# Patient Record
Sex: Male | Born: 1940 | Race: White | Hispanic: No | Marital: Married | Smoking: Former smoker
Health system: Southern US, Community
[De-identification: ages and names within clinical notes are randomized; demographics above are authoritative.]

## PROBLEM LIST (undated history)

## (undated) DIAGNOSIS — E785 Hyperlipidemia, unspecified: Secondary | ICD-10-CM

## (undated) DIAGNOSIS — J302 Other seasonal allergic rhinitis: Secondary | ICD-10-CM

## (undated) DIAGNOSIS — I10 Essential (primary) hypertension: Secondary | ICD-10-CM

## (undated) DIAGNOSIS — G4730 Sleep apnea, unspecified: Secondary | ICD-10-CM

## (undated) HISTORY — DX: Other seasonal allergic rhinitis: J30.2

## (undated) HISTORY — DX: Sleep apnea, unspecified: G47.30

## (undated) HISTORY — DX: Essential (primary) hypertension: I10

## (undated) HISTORY — DX: Hyperlipidemia, unspecified: E78.5

---

## 1993-07-01 HISTORY — PX: WISDOM TOOTH EXTRACTION: SHX21

## 1999-09-03 ENCOUNTER — Encounter: Payer: Self-pay | Admitting: Family Medicine

## 1999-09-03 ENCOUNTER — Encounter: Admission: RE | Admit: 1999-09-03 | Discharge: 1999-09-03 | Payer: Self-pay | Admitting: Family Medicine

## 2005-08-04 ENCOUNTER — Ambulatory Visit (HOSPITAL_BASED_OUTPATIENT_CLINIC_OR_DEPARTMENT_OTHER): Admission: RE | Admit: 2005-08-04 | Discharge: 2005-08-04 | Payer: Self-pay | Admitting: Family Medicine

## 2005-08-11 ENCOUNTER — Ambulatory Visit: Payer: Self-pay | Admitting: Internal Medicine

## 2006-08-21 ENCOUNTER — Encounter: Admission: RE | Admit: 2006-08-21 | Discharge: 2006-08-21 | Payer: Self-pay | Admitting: Family Medicine

## 2007-06-30 IMAGING — CR DG CHEST 2V
2 series · 2 of 2 positions shown · non-contrast
Comparison: none

CLINICAL DATA: Productive cough for several days. 
 CHEST - 2 VIEW:

[view not recorded (1 of 2)]
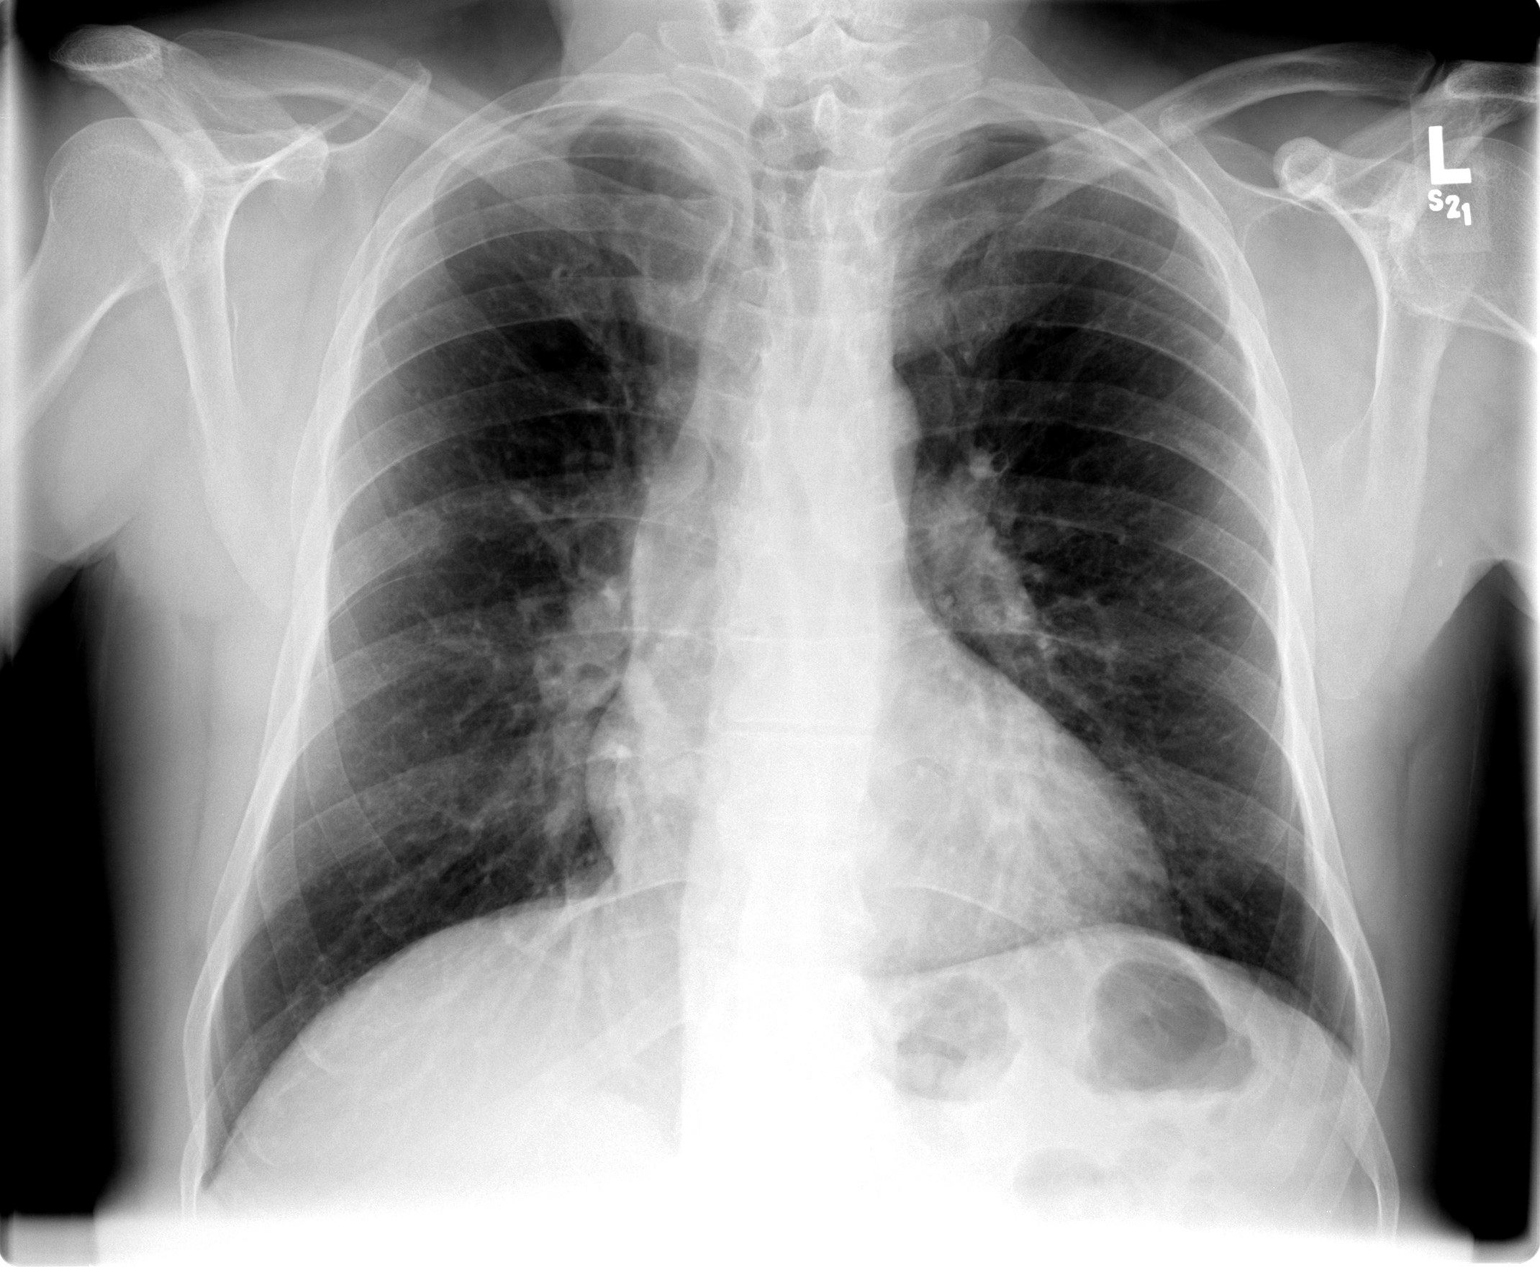

[view not recorded (2 of 2)]
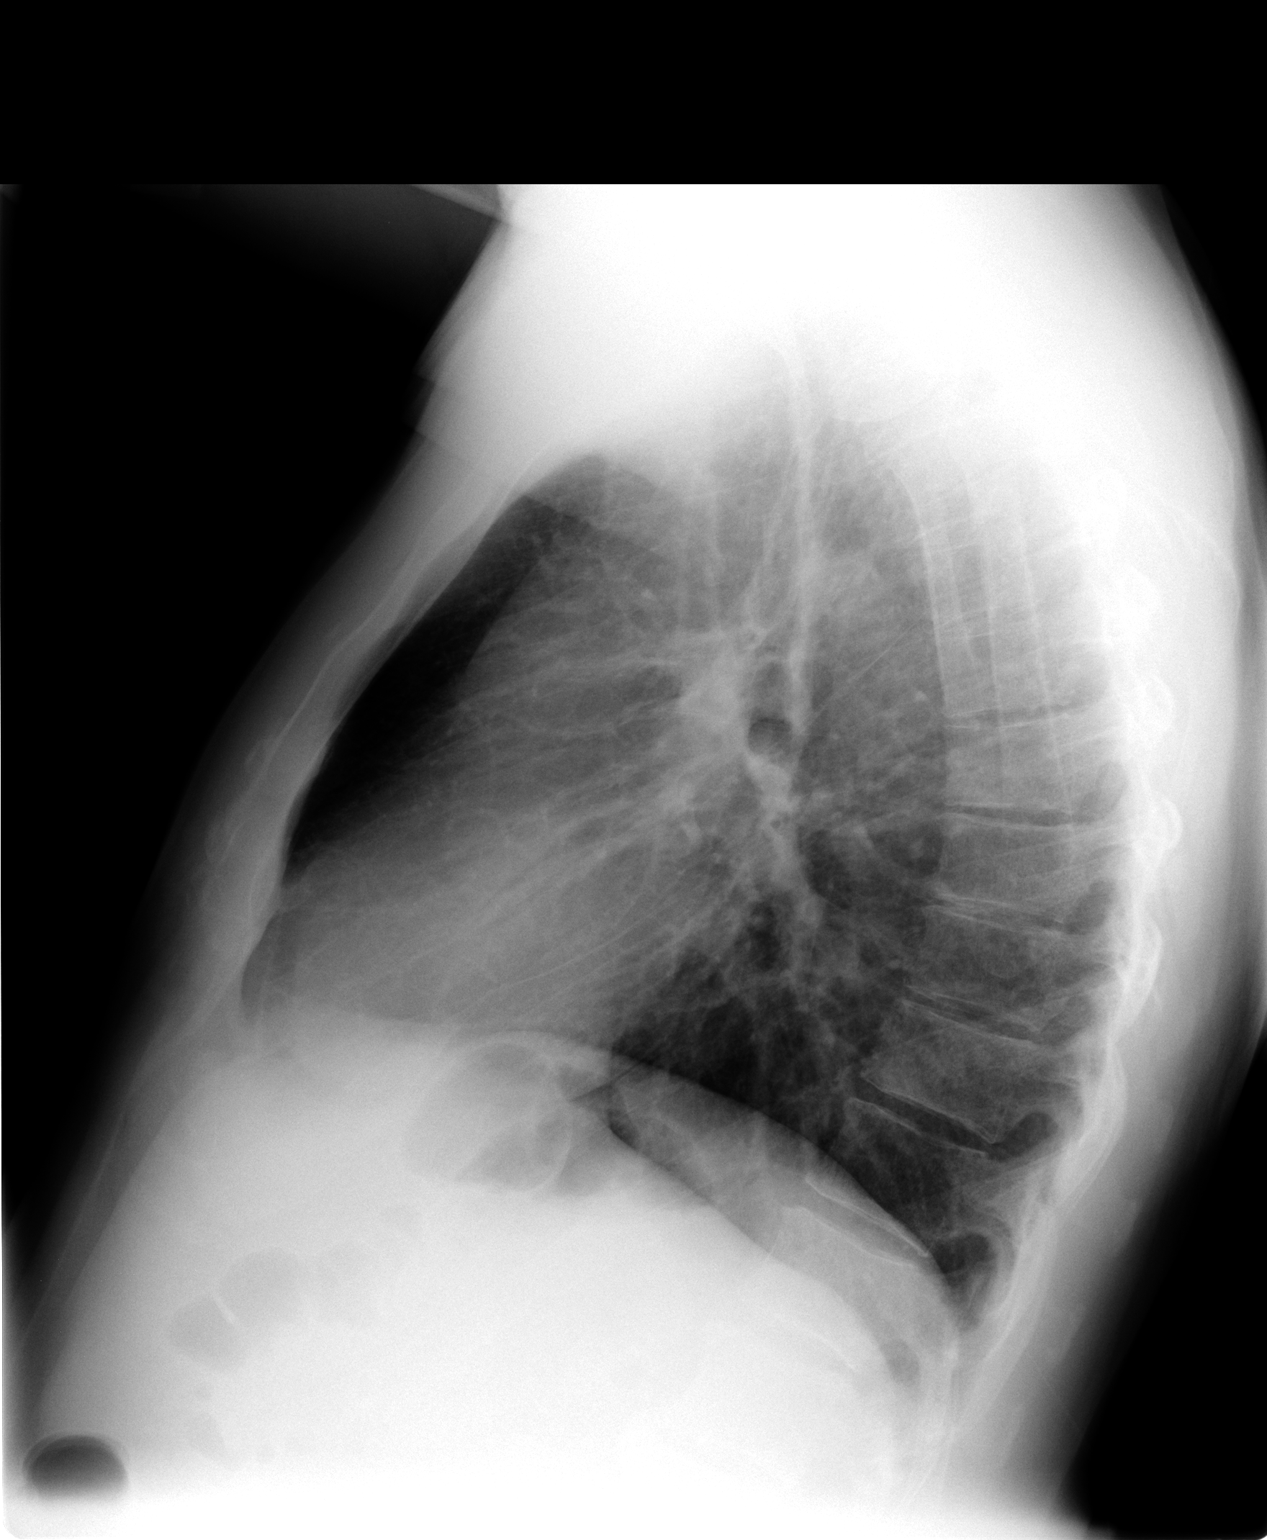

[2 of 2 positions shown; findings below may reference images not displayed]

FINDINGS: Two views of the chest show no pneumonia.  Mild peribronchial thickening is noted.  The heart is within normal limits in size.
IMPRESSION: No pneumonia.  Mild peribronchial thickening.

## 2008-07-01 HISTORY — PX: HERNIA REPAIR: SHX51

## 2008-10-07 ENCOUNTER — Ambulatory Visit (HOSPITAL_COMMUNITY): Admission: RE | Admit: 2008-10-07 | Discharge: 2008-10-07 | Payer: Self-pay | Admitting: General Surgery

## 2009-08-14 IMAGING — CR DG CHEST 2V
2 series · 2 of 2 positions shown · non-contrast
Comparison: 08/21/2006

CLINICAL DATA: Preop for left inguinal hernia

CHEST - 2 VIEW

[view not recorded (1 of 2)]
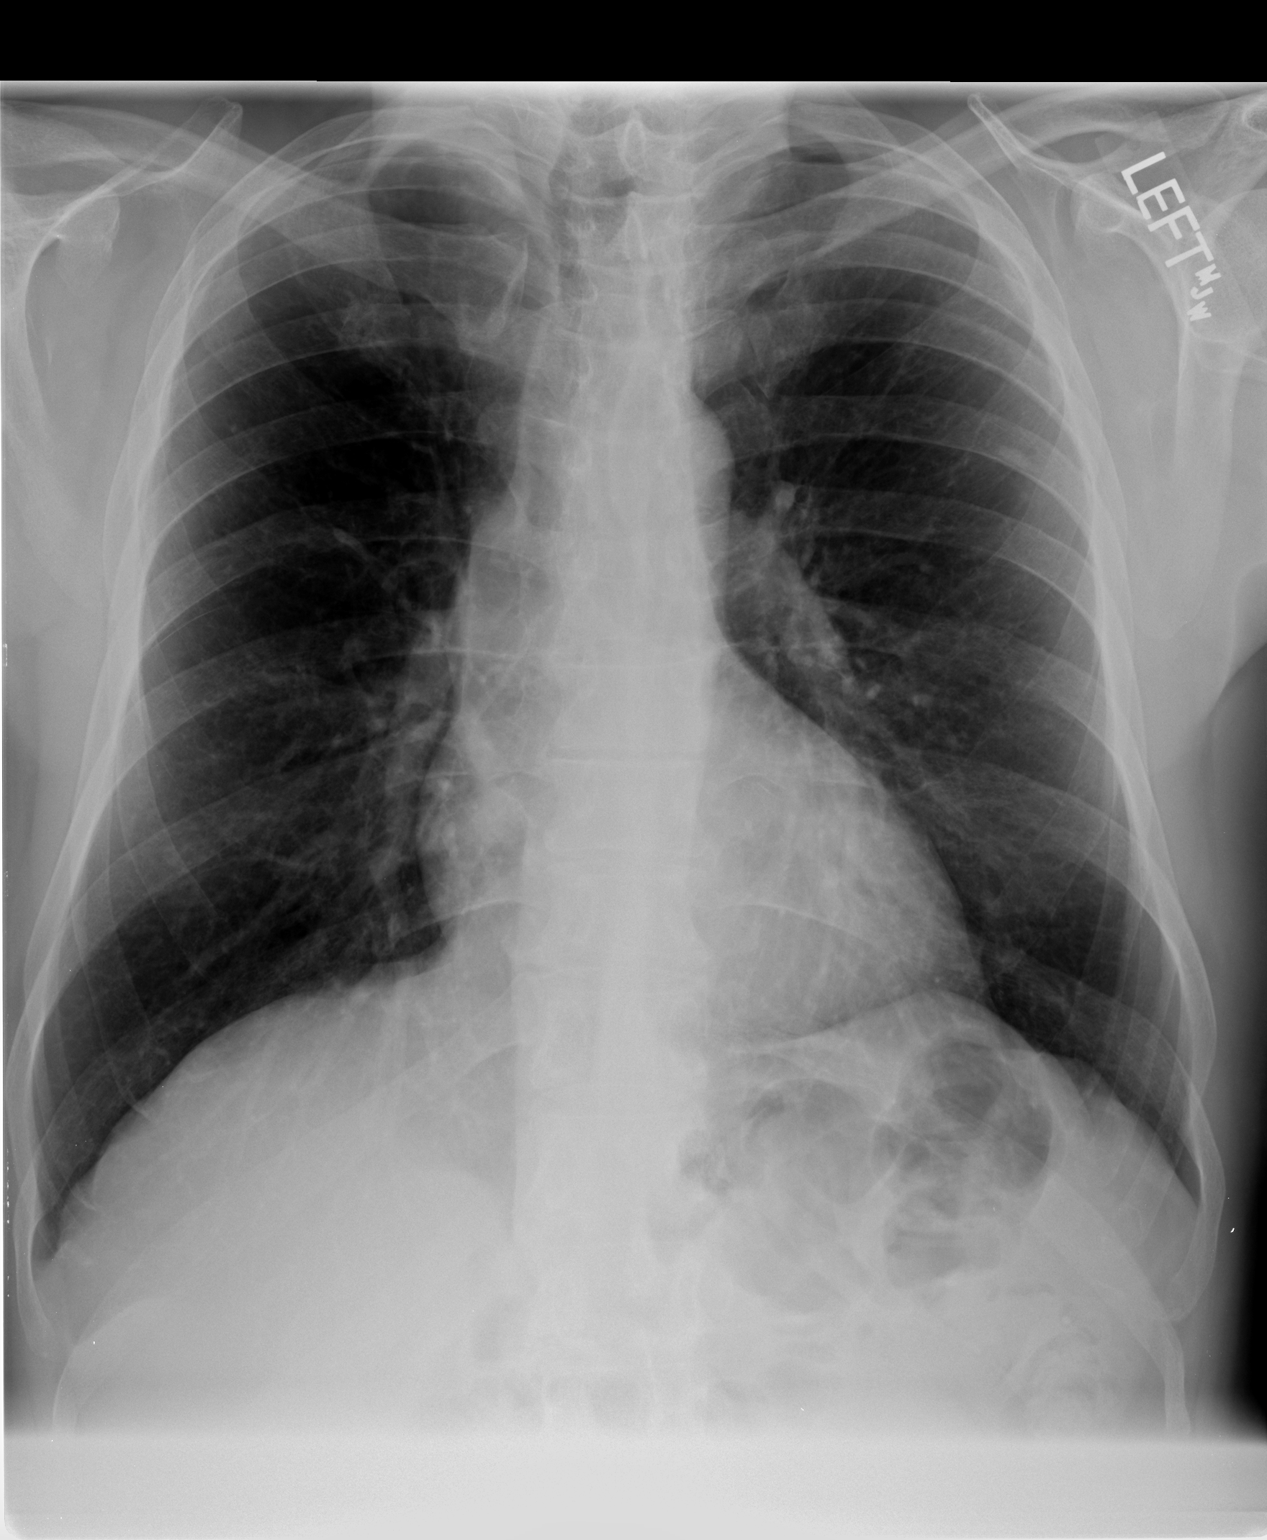

[view not recorded (2 of 2)]
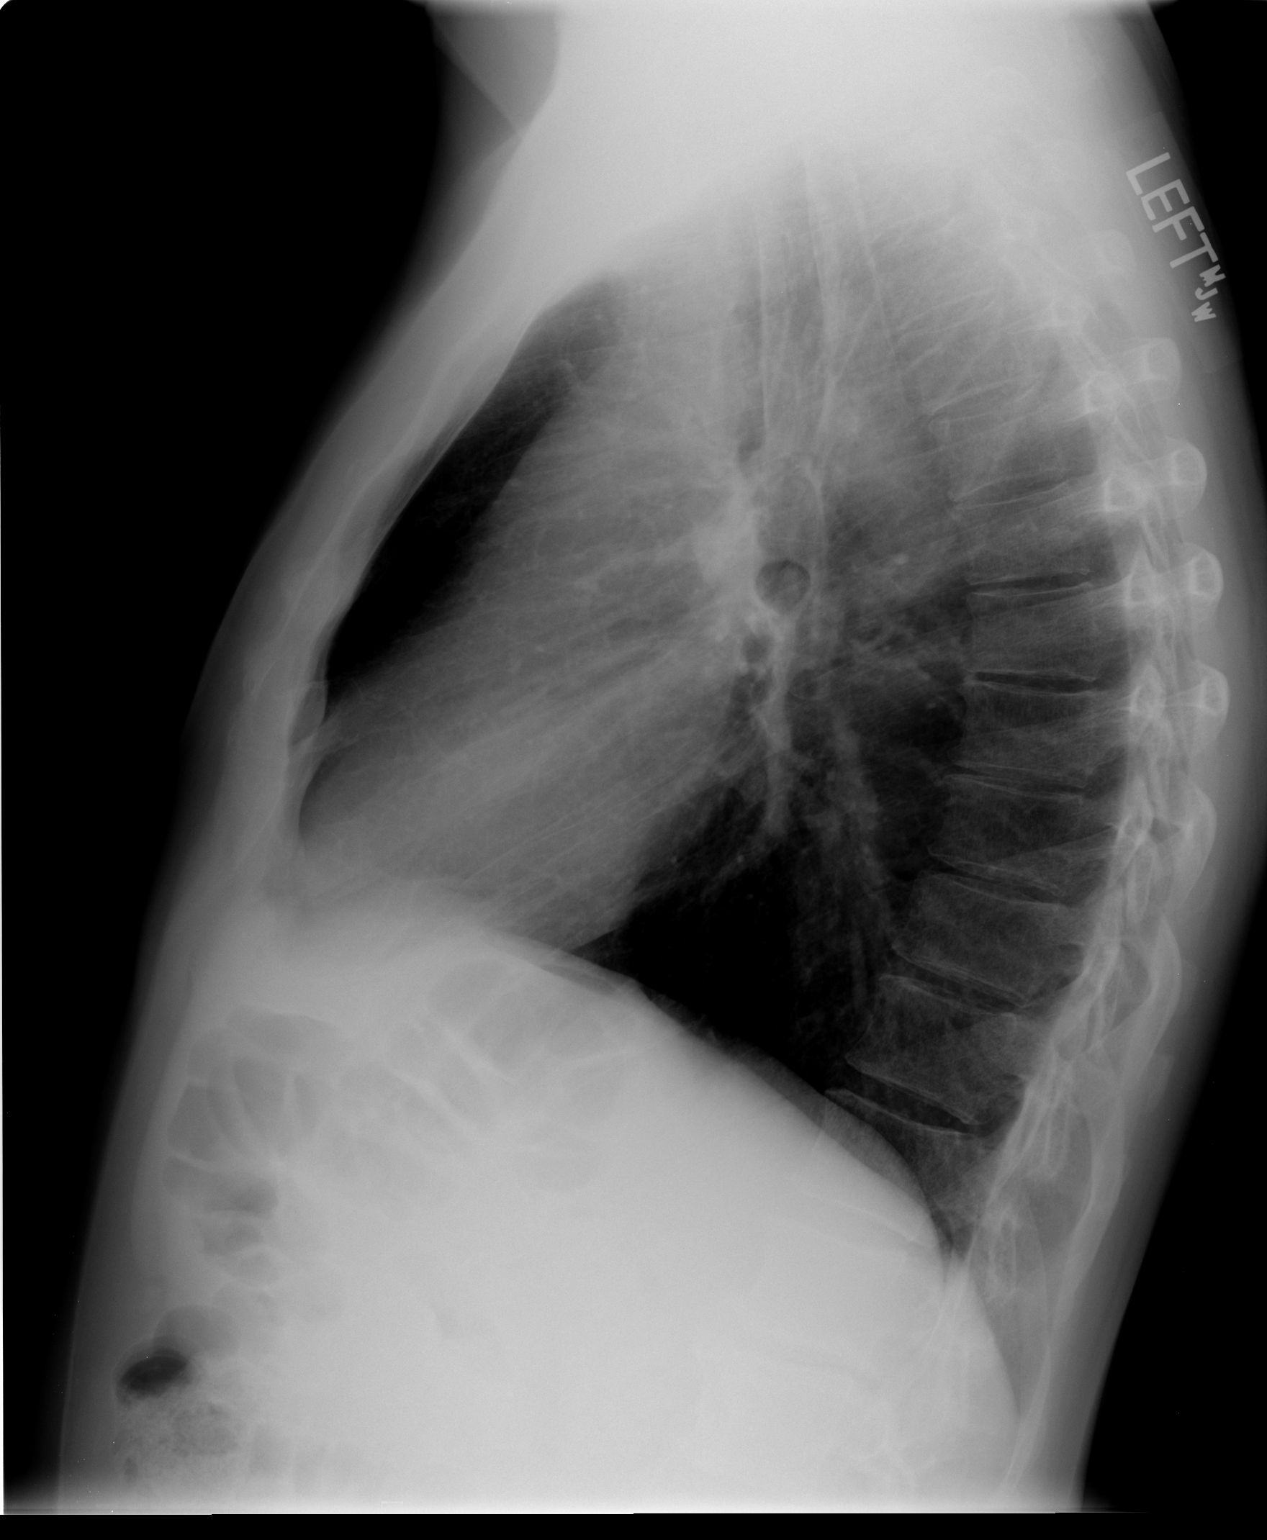

[2 of 2 positions shown; findings below may reference images not displayed]

FINDINGS: Heart and mediastinal contours normal.  Lungs clear.  The
lungs are mildly hyperaerated.  Probable early COPD.Osseous
structures intact with a stable small sclerotic ovoid density in
the left posterior sixth rib.  This is probably a small bone island
or other benign bone lesion.  No pleural fluid.
IMPRESSION: 1.  Probable COPD - no active disease.
2.  Small sclerotic lesion of the left posterior sixth rib - no
change.

## 2010-10-10 LAB — CBC
HCT: 41.2 % (ref 39.0–52.0)
Hemoglobin: 14 g/dL (ref 13.0–17.0)
MCHC: 33.9 g/dL (ref 30.0–36.0)
MCV: 91.7 fL (ref 78.0–100.0)
Platelets: 245 10*3/uL (ref 150–400)
RBC: 4.49 MIL/uL (ref 4.22–5.81)
RDW: 13.2 % (ref 11.5–15.5)
WBC: 6.6 10*3/uL (ref 4.0–10.5)

## 2010-10-10 LAB — BASIC METABOLIC PANEL WITH GFR
BUN: 12 mg/dL (ref 6–23)
CO2: 26 meq/L (ref 19–32)
Calcium: 9.4 mg/dL (ref 8.4–10.5)
Chloride: 103 meq/L (ref 96–112)
Creatinine, Ser: 1.17 mg/dL (ref 0.4–1.5)
GFR calc Af Amer: >60 mL/min (ref >60)
GFR calc non Af Amer: >60 mL/min (ref >60)
Glucose, Bld: 102 mg/dL — ABNORMAL HIGH (ref 70–99)
Potassium: 4.4 meq/L (ref 3.5–5.1)
Sodium: 136 meq/L (ref 135–145)

## 2010-11-13 NOTE — Op Note (Signed)
NAMESALOMON, GANSER NO.:  [id number]   MEDICAL RECORD NO.:  [id number]          PATIENT TYPE:  AMB   LOCATION:  SDS                          FACILITY:  [facility]   PHYSICIAN:  Lennie Muckle, MD      DATE OF BIRTH:  April 23, 1941   DATE OF PROCEDURE:  10/07/2008  DATE OF DISCHARGE:  10/07/2008                               OPERATIVE REPORT   PREOPERATIVE DIAGNOSIS:  Left inguinal hernia.   POSTOPERATIVE DIAGNOSIS:  Left inguinal hernia.   PROCEDURE:  Open hernia repair with mesh on the left.   SURGEON:  Lennie Muckle, MD.   ASSISTANT:  No assistant.   ANESTHESIA:  General endotracheal anesthesia.   FINDINGS:  A week 4 with indirect hernia.   SPECIMENS:  No specimens.   COMPLICATIONS:  No immediate complications.   ESTIMATED BLOOD LOSS:  Minimal blood loss.   INDICATION FOR PROCEDURE:  Mr. Scheidt is a 70 year old male who has had a  couple of months of left lower abdominal pain.  No nausea or vomiting.  He did notice a bulge in his groin.  His examination was consistent with  a left inguinal hernia repair.  I had discussed with him preoperatively  of laparoscopic open repair.  He was given reading material on this  subject and we have decided to perform an open repair.   DETAILS OF PROCEDURE:  He received a g of vancomycin and was taken to  the operating room.  Once in the operating room, he was placed in the  supine position.  After administration of general endotracheal  anesthesia, his lower abdomen was prepped and draped in usual sterile  fashion.  A time-out procedure indicating the patient and the procedure  were performed.  I placed an incision along the area of the ilioinguinal  ligament with a #15 blade, divided subcutaneous tissues with  electrocautery and I then placed an incision at external oblique fascia.  I opened this with Metzenbaum scissors to open the internal-external  ring.  I then dissected out the shelving edge of the ilioinguinal  ligament.  I got around the spermatic cord and the sac at the pubic  tubercle.  A Penrose drain was placed around the vessels and sac and  then bluntly dissected the hernia sac away from the spermatic cord and  vessels.  I ligated this with a 2-0 silk suture.  This was allowed to  retract back into the abdominal cavity.  The vas deferens vessels  remained intact.  I then secured the piece of 3 x 6 UltraPro mesh which  was trimmed down to size 2.  The ilioinguinal ligament shelving edge and  the internal and transverse oblique musculature with the 2-0 Prolene  suture in a running fashion.  I overlapped the tails around the internal  ring.  I closed down the internal ring to only a pinky palpation for the  exit.  I took the tails under the external oblique fascia.  I then  reapproximated the external oblique fascia over the mesh.  Scarpa was  reapproximated with 3-0  Vicryl suture.  Dermis was closed with 3-0  Vicryl and  skin was closed with 4-0 Monocryl.  The Dermabond was placed with final  dressing.  The patient was extubated, transported to postanesthesia care  unit in stable condition.  He will be given Percocet for pain.  Follow  up with me in approximately 2 or 3 weeks.      Lennie Muckle, MD  Electronically Signed     ALA/MEDQ  D:  10/07/2008  T:  10/08/2008  Job:  [id number]   cc:   Tracey Harries, M.D.

## 2010-11-16 NOTE — Procedures (Signed)
NAME:  Darin [name], Darin [name] NO.:  [id number]   MEDICAL RECORD NO.:  [id number]          PATIENT TYPE:  OUT   LOCATION:  SLEEP CENTER                 FACILITY:  [facility]   PHYSICIAN:  Clinton D. Maple Hudson, M.D. DATE OF BIRTH:  January 02, 1941   DATE OF STUDY:  08/04/2005                              NOCTURNAL POLYSOMNOGRAM   REFERRING PHYSICIAN:  Quita Skye. Artis Flock, M.D.   DATE OF STUDY:  August 04, 2005.   INDICATION FOR STUDY:  Hypersomnia with sleep apnea.   EPWORTH SLEEPINESS SCORE:  16/24.   BMI:  24.   WEIGHT:  170 pounds.   MEDICATIONS:  Lipitor, aspirin.   SLEEP ARCHITECTURE:  Total sleep time 319 minutes with sleep efficiency 80%.  Stage 1 was 18%.  Stage 2 52%.  States 3 and 4 11%.  REM 20% of total sleep  time.  Sleep latency 3.5 minutes.  REM latency 105 minutes.  Awake after  sleep onset 76 minutes.  Arousal index increased at 54.  No bedtime  medication was taken.   RESPIRATORY DATA:  Split study protocol.  Apnea/hypopnea index (AHI, RDI)  89.2 obstructive events per hour indicating severe obstructive sleep  apnea/hypopnea syndrome.  This included 72 obstructive apneas and 142  hypopneas before C-PAP.  Events were not positional.  REM AHI 14 per hour.  C-PAP was titrated to 10 CWP, AHI 1.8 per hour.  A small Respironics comfort  gel nasal mask was used with a heated humidifier.   OXYGEN DATA:  Moderate snoring with oxygen desaturation to a nadir of 89%.  After C-PAP control, saturation held 96% on room air.   CARDIAC DATA:  Normal sinus rhythm with sinus arrhythmia associated with  apneas.   MOVEMENT-PARASOMNIA:  A total of 59 limb jerks were recorded of which 21  were associated with arousal or awakening for a periodic limb movement with  arousal index of 3.9 per hour which is increased.   IMPRESSIONS-RECOMMENDATIONS:  1.  Severe obstructive sleep apnea/hypopnea syndrome, AHI 89.2 per hour with      nonpositional events, moderate snoring and oxygen  desaturation to 89%.  2.  Successful C-PAP titration to 10 CWP, AHI 1.8 per hour.  A small      Respironics comfort gel nasal mask was used with heated humidifier.  3.  Periodic limb movement with arousal, 3.9 per hour.  This may partly      reflect stimulation from the monitor and C-PAP adjustment.  If      clinically significant, it can be treated separately later.      Clinton D. Maple Hudson, M.D.  Diplomate, Biomedical engineer of Sleep Medicine  Electronically Signed     CDY/MEDQ  D:  08/11/2005 09:14:57  T:  08/12/2005 08:19:14  Job:  [id number]

## 2011-03-13 ENCOUNTER — Encounter: Payer: Self-pay | Admitting: Internal Medicine

## 2012-04-01 ENCOUNTER — Encounter: Payer: Self-pay | Admitting: Internal Medicine

## 2013-02-26 ENCOUNTER — Other Ambulatory Visit: Payer: Self-pay | Admitting: Nurse Practitioner

## 2013-02-26 DIAGNOSIS — R9389 Abnormal findings on diagnostic imaging of other specified body structures: Secondary | ICD-10-CM

## 2013-03-10 ENCOUNTER — Other Ambulatory Visit: Payer: Self-pay

## 2014-04-26 ENCOUNTER — Encounter: Payer: Self-pay | Admitting: Internal Medicine

## 2014-06-08 ENCOUNTER — Encounter: Payer: Self-pay | Admitting: Internal Medicine

## 2014-06-15 ENCOUNTER — Encounter: Payer: Self-pay | Admitting: Internal Medicine

## 2014-06-27 ENCOUNTER — Ambulatory Visit (AMBULATORY_SURGERY_CENTER): Payer: Self-pay | Admitting: *Deleted

## 2014-06-27 VITALS — Ht 70.0 in | Wt 172.4 lb

## 2014-06-27 DIAGNOSIS — Z1211 Encounter for screening for malignant neoplasm of colon: Secondary | ICD-10-CM

## 2014-06-27 MED ORDER — MOVIPREP 100 G PO SOLR: 1.0000 | Freq: Once | ORAL | Status: DC

## 2014-06-27 NOTE — Progress Notes (Signed)
Patient has history of colonoscopy greater than 10 years ago, possibly with [facility]. He will look for report at home and bring with him to his procedure.

## 2014-06-27 NOTE — Progress Notes (Signed)
Denies allergies to eggs or soy products. Denies complications with sedation or anesthesia. Denies O2 use. Denies use of diet or weight loss medications.  Emmi instructions given for colonoscopy.  

## 2014-07-11 ENCOUNTER — Encounter: Payer: Self-pay | Admitting: Internal Medicine

## 2014-07-26 ENCOUNTER — Ambulatory Visit (AMBULATORY_SURGERY_CENTER): Payer: Medicare Other | Admitting: Internal Medicine

## 2014-07-26 ENCOUNTER — Encounter: Payer: Self-pay | Admitting: Internal Medicine

## 2014-07-26 VITALS — BP 158/73 | HR 60 | Temp 98.1°F | Resp 13 | Ht 70.0 in | Wt 172.0 lb

## 2014-07-26 DIAGNOSIS — D124 Benign neoplasm of descending colon: Secondary | ICD-10-CM

## 2014-07-26 DIAGNOSIS — D123 Benign neoplasm of transverse colon: Secondary | ICD-10-CM

## 2014-07-26 DIAGNOSIS — Z1211 Encounter for screening for malignant neoplasm of colon: Secondary | ICD-10-CM

## 2014-07-26 DIAGNOSIS — D125 Benign neoplasm of sigmoid colon: Secondary | ICD-10-CM

## 2014-07-26 MED ORDER — SODIUM CHLORIDE 0.9 % IV SOLN: 500.0000 mL | INTRAVENOUS | Status: DC

## 2014-07-26 NOTE — Progress Notes (Signed)
Report to PACU, RN, vss, BBS= Clear.  

## 2014-07-26 NOTE — Op Note (Signed)
[place]  [address]. [place], [address]   COLONOSCOPY PROCEDURE REPORT  PATIENT: Darin, [name]  MR#: [id number] BIRTHDATE: 1941-01-01 , 73  yrs. old GENDER: male ENDOSCOPIST: Jerene Bears, MD REFERRED LH:TDSKA Bouska, M.D. PROCEDURE DATE:  07/26/2014 PROCEDURE:   Colonoscopy with snare polypectomy First Screening Colonoscopy - Avg.  risk and is 50 yrs.  old or older - No.  Prior Negative Screening - Now for repeat screening. 10 or more years since last screening  History of Adenoma - Now for follow-up colonoscopy & has been > or = to 3 yrs.  N/A  Polyps Removed Today? Yes. ASA CLASS:   Class II INDICATIONS:average risk for colon cancer and last colonoscopy completed  years ago. MEDICATIONS: Monitored anesthesia care and Propofol 220 mg IV  DESCRIPTION OF PROCEDURE:   After the risks benefits and alternatives of the procedure were thoroughly explained, informed consent was obtained.  The digital rectal exam revealed no rectal mass.   The LB [id number]  endoscope was introduced through the anus and advanced to the cecum, which was identified by both the appendix and ileocecal valve. No adverse events experienced. The quality of the prep was good, using MoviPrep  The instrument was then slowly withdrawn as the colon was fully examined.   COLON FINDINGS: Five sessile polyps ranging between 3-25mm in size were found in the transverse colon (1), descending colon (2), and sigmoid colon (2).  Polypectomies were performed with a cold snare. The resection was complete, the polyp tissue was completely retrieved and sent to histology.   There was moderate diverticulosis noted throughout the entire examined colon sparing the cecum and rectum.  Retroflexed views revealed internal hemorrhoids. The time to cecum=6 minutes 56 seconds.  Withdrawal time=12 minutes 58 seconds.  The scope was withdrawn and the procedure completed.  COMPLICATIONS: There were no  immediate complications.     ENDOSCOPIC IMPRESSION: 1.   Five sessile polyps ranging between 3-74mm in size were found in the transverse colon, descending colon, and sigmoid colon; polypectomies were performed with a cold snare 2.   Moderate diverticulosis was noted throughout the entire examined colon sparing the cecum and rectum  RECOMMENDATIONS: 1.  Await pathology results 2.  High fiber diet 3.  Timing of repeat colonoscopy will be determined by pathology findings. 4.  You will receive a letter within 1-2 weeks with the results of your biopsy as well as final recommendations.  Please call my office if you have not received a letter after 3 weeks.  eSigned:  Jerene Bears, MD 07/26/2014 4:33 PM   cc: Bernerd Limbo, MD and The Patient   PATIENT NAME:  Darin, [name] MR#: [id number]

## 2014-07-26 NOTE — Patient Instructions (Signed)

## 2014-07-26 NOTE — Progress Notes (Signed)
Called to room to assist during endoscopic procedure.  Patient ID and intended procedure confirmed with present staff. Received instructions for my participation in the procedure from the performing physician.  

## 2014-07-27 ENCOUNTER — Telehealth: Payer: Self-pay | Admitting: *Deleted

## 2014-07-27 NOTE — Telephone Encounter (Signed)
  Follow up Call-  Call back number 07/26/2014  Post procedure Call Back phone  # [phone]  Permission to leave phone message Yes     Patient questions:  Do you have a fever, pain , or abdominal swelling? No. Pain Score  0 *  Have you tolerated food without any problems? Yes.    Have you been able to return to your normal activities? Yes.    Do you have any questions about your discharge instructions: Diet   No. Medications  No. Follow up visit  No.  Do you have questions or concerns about your Care? No.  Actions: * If pain score is 4 or above: No action needed, pain <4.

## 2014-08-01 ENCOUNTER — Encounter: Payer: Self-pay | Admitting: Internal Medicine

## 2016-05-07 ENCOUNTER — Ambulatory Visit: Payer: Medicare Other | Attending: Family Medicine | Admitting: Physical Therapy

## 2016-05-07 ENCOUNTER — Encounter: Payer: Self-pay | Admitting: Physical Therapy

## 2016-05-07 DIAGNOSIS — R2689 Other abnormalities of gait and mobility: Secondary | ICD-10-CM | POA: Insufficient documentation

## 2016-05-07 NOTE — Therapy (Signed)
[place], [place], [address] Phone: [phone]   Fax:  [phone]  Physical Therapy Evaluation  Patient Details  Name: Darin [name] MRN: [id number] Date of Birth: Oct 26, 1940 Referring Provider: Coletta Memos  Encounter Date: 05/07/2016      PT End of Session - 05/07/16 0914    Visit Number 1   Date for PT Re-Evaluation 06/06/16   PT Start Time 0846   PT Stop Time 0919   PT Time Calculation (min) 33 min   Activity Tolerance Patient tolerated treatment well   Behavior During Therapy [facility] for tasks assessed/performed      Past Medical History:  Diagnosis Date  . Hyperlipidemia   . Hypertension   . Seasonal allergies   . Sleep apnea with use of continuous positive airway pressure (CPAP)     Past Surgical History:  Procedure Laterality Date  . HERNIA REPAIR  2010  . WISDOM TOOTH EXTRACTION  1995    There were no vitals filed for this visit.       Subjective Assessment - 05/07/16 0847    Subjective Patient reports that over the past year or so he has been having difficutly with balance while doing yoga.  He goes to yoga 1x/week.  He denies falls.  He reports some ringing in the ears at times.     Patient Stated Goals feel better with my balance   Currently in Pain? No/denies            [facility] PT Assessment - 05/07/16 0001      Assessment   Medical Diagnosis unsteady gait   Referring Provider Bouska   Onset Date/Surgical Date 04/06/16   Prior Therapy no     Precautions   Precautions None     Balance Screen   Has the patient fallen in the past 6 months No   Has the patient had a decrease in activity level because of a fear of falling?  No   Is the patient reluctant to leave their home because of a fear of falling?  No     Home Environment   Additional Comments a few steps into home, does yardwork     Prior Function   Level of Independence Independent   Vocation Retired   Leisure golf1x/week, yoga 1x/week,, some walking     ROM / Strength   AROM / PROM / Strength AROM;Strength     AROM   Overall AROM Comments LE's and lumbar spine WNL's     Strength   Overall Strength Comments 4-/5 for the LE's     Standardized Balance Assessment   Standardized Balance Assessment Berg Balance Test     Berg Balance Test   Sit to Stand Able to stand without using hands and stabilize independently   Standing Unsupported Able to stand safely 2 minutes   Sitting with Back Unsupported but Feet Supported on Floor or Stool Able to sit safely and securely 2 minutes   Stand to Sit Sits safely with minimal use of hands   Transfers Able to transfer safely, minor use of hands   Standing Unsupported with Eyes Closed Able to stand 10 seconds safely   Standing Ubsupported with Feet Together Able to place feet together independently and stand 1 minute safely   From Standing, Reach Forward with Outstretched Arm Can reach confidently >25 cm (10")   From Standing Position, Pick up Object from Floor Able to pick up shoe safely  and easily   From Standing Position, Turn to Look Behind Over each Shoulder Looks behind one side only/other side shows less weight shift   Turn 360 Degrees Able to turn 360 degrees safely in 4 seconds or less   Standing Unsupported, Alternately Place Feet on Step/Stool Able to stand independently and safely and complete 8 steps in 20 seconds   Standing Unsupported, One Foot in Front Able to plae foot ahead of the other independently and hold 30 seconds   Standing on One Leg Able to lift leg independently and hold 5-10 seconds   Total Score 53                           PT Education - 05/07/16 0910    Education provided Yes   Education Details Gave HEP for balance, high level with firm and foam surfaces, eyes open and closed, changing BOS   Person(s) Educated Patient   Methods Explanation;Demonstration;Handout   Comprehension Verbalized  understanding             PT Long Term Goals - 05/07/16 0919      PT LONG TERM GOAL #1   Title Independent with advanced HEP   Time 2   Period Weeks   Status New               Plan - 05/07/16 0915    Clinical Impression Statement Patient reports that he has been having some difficulty with balance over the past year.  He reports no falls but difficulty with balance in his yoga class.  His Berg balance score wsa 53/56, he did have some difficulty on dynamic surfaces with eyes closed and with head turns   Rehab Potential Excellent   PT Frequency 1x / week   PT Duration 4 weeks   PT Treatment/Interventions Balance training;Neuromuscular re-education;Therapeutic activities;Therapeutic exercise   PT Next Visit Plan Gave patient advanced HEP for balance.  He will try on own, I did not have him make a follow up appointment because I think he will do well with this.  Willd D/c   Consulted and Agree with Plan of Care Patient      Patient will benefit from skilled therapeutic intervention in order to improve the following deficits and impairments:  Decreased balance  Visit Diagnosis: Other abnormalities of gait and mobility - Plan: PT plan of care cert/re-cert      G-Codes - [id number]    Functional Assessment Tool Used PT discretion   Functional Limitation Mobility: Walking and moving around   Mobility: Walking and Moving Around Current Status [id number]) At least 1 percent but less than 20 percent impaired, limited or restricted   Mobility: Walking and Moving Around Goal Status [id number]) At least 1 percent but less than 20 percent impaired, limited or restricted       Problem List There are no active problems to display for this patient.   Sumner Boast., PT 05/07/2016, 9:24 AM  [place] [id number] [facility] [place], [place], [address] Phone: [phone]   Fax:  [phone]  Name: LACY COUNTESS MRN:  [id number] Date of Birth: Dec 09, 1940

## 2019-07-27 ENCOUNTER — Ambulatory Visit: Payer: Medicare Other
